# Patient Record
Sex: Female | Born: 1975 | Race: Black or African American | Hispanic: No | Marital: Single | Smoking: Current every day smoker
Health system: Southern US, Community
[De-identification: ages and names within clinical notes are randomized; demographics above are authoritative.]

## PROBLEM LIST (undated history)

## (undated) DIAGNOSIS — G629 Polyneuropathy, unspecified: Secondary | ICD-10-CM

## (undated) DIAGNOSIS — F32A Depression, unspecified: Secondary | ICD-10-CM

## (undated) DIAGNOSIS — F419 Anxiety disorder, unspecified: Secondary | ICD-10-CM

## (undated) DIAGNOSIS — F329 Major depressive disorder, single episode, unspecified: Secondary | ICD-10-CM

## (undated) DIAGNOSIS — R609 Edema, unspecified: Secondary | ICD-10-CM

---

## 1998-03-30 ENCOUNTER — Other Ambulatory Visit: Admission: RE | Admit: 1998-03-30 | Discharge: 1998-03-30 | Payer: Self-pay | Admitting: Dermatology

## 2008-11-27 ENCOUNTER — Emergency Department (HOSPITAL_COMMUNITY): Admission: EM | Admit: 2008-11-27 | Discharge: 2008-11-27 | Payer: Self-pay | Admitting: Emergency Medicine

## 2008-12-31 ENCOUNTER — Emergency Department (HOSPITAL_COMMUNITY): Admission: EM | Admit: 2008-12-31 | Discharge: 2008-12-31 | Payer: Self-pay | Admitting: Emergency Medicine

## 2009-01-04 ENCOUNTER — Emergency Department (HOSPITAL_COMMUNITY): Admission: EM | Admit: 2009-01-04 | Discharge: 2009-01-04 | Payer: Self-pay | Admitting: Emergency Medicine

## 2009-01-23 ENCOUNTER — Emergency Department (HOSPITAL_COMMUNITY): Admission: EM | Admit: 2009-01-23 | Discharge: 2009-01-23 | Payer: Self-pay | Admitting: Emergency Medicine

## 2009-02-22 ENCOUNTER — Emergency Department: Payer: Self-pay | Admitting: Emergency Medicine

## 2010-05-04 IMAGING — CR DG FOOT COMPLETE 3+V*R*
3 series · 3 of 3 positions shown · non-contrast
Comparison: None available

CLINICAL DATA: Fell

RIGHT FOOT COMPLETE - 3+ VIEW

[view not recorded (1 of 3)]
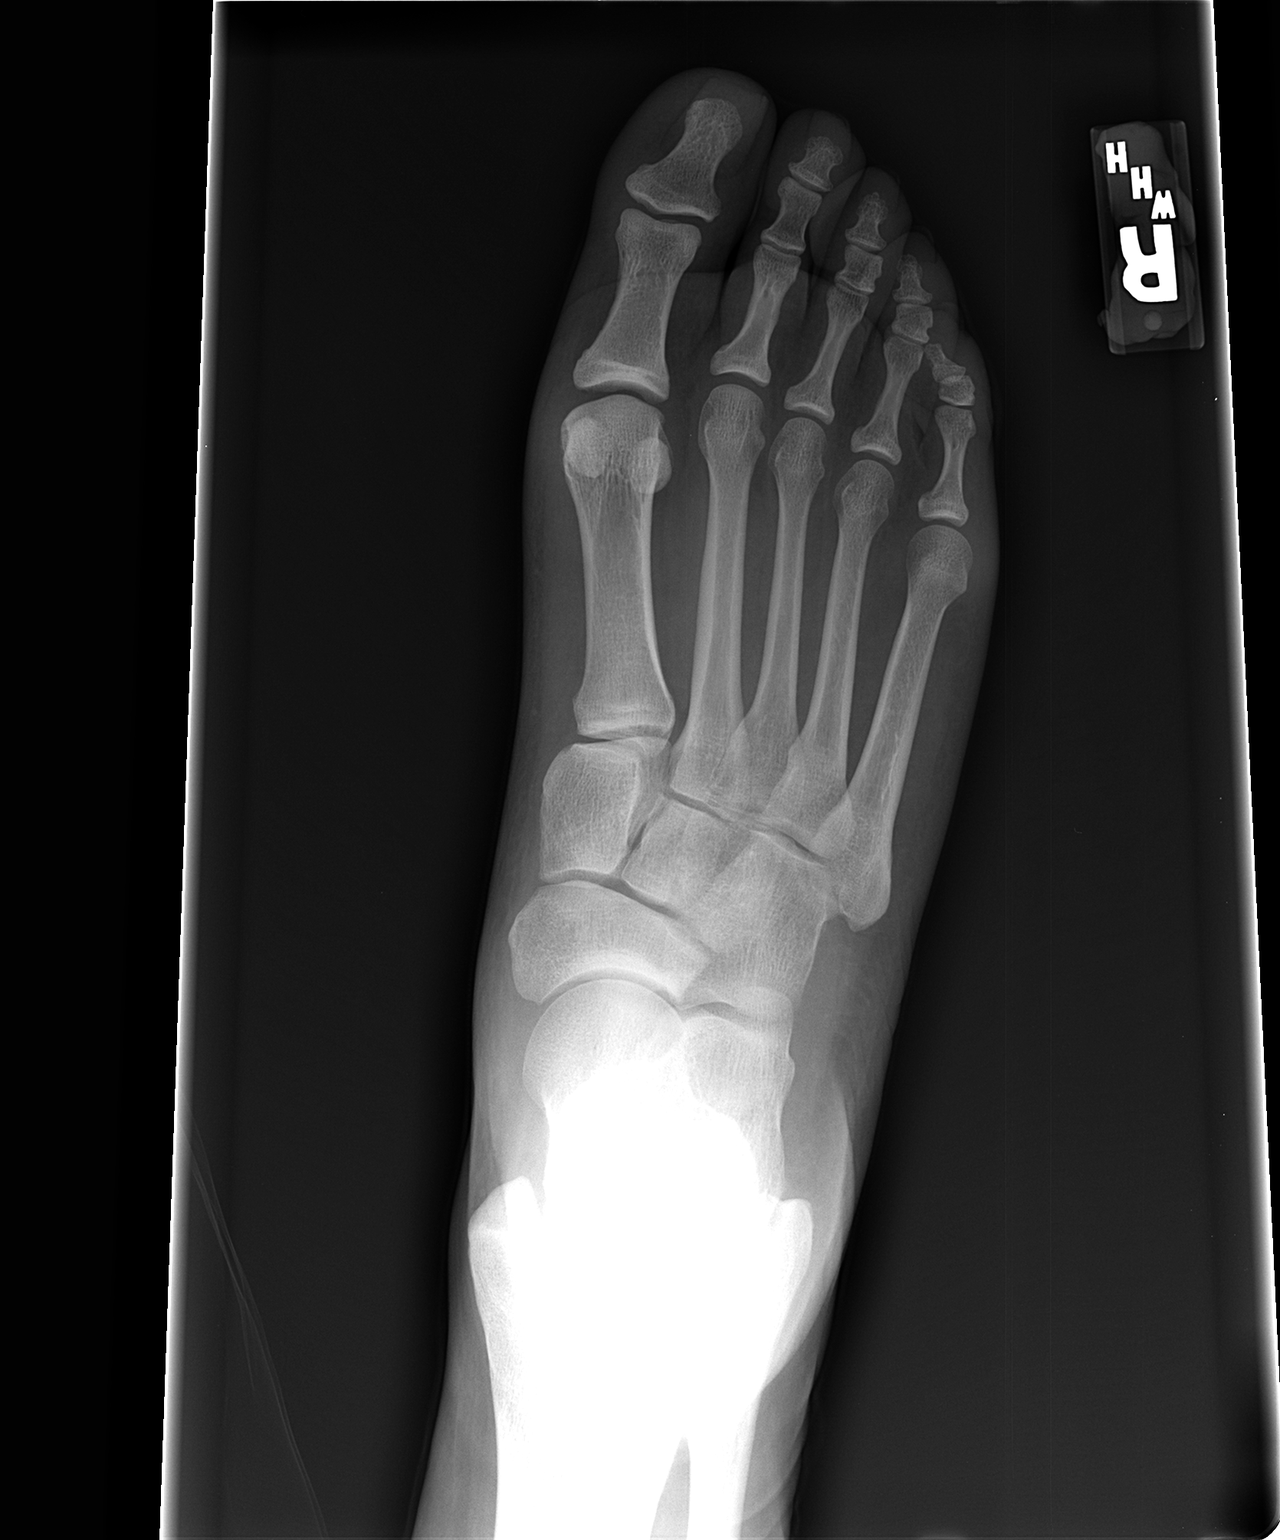

[view not recorded (2 of 3)]
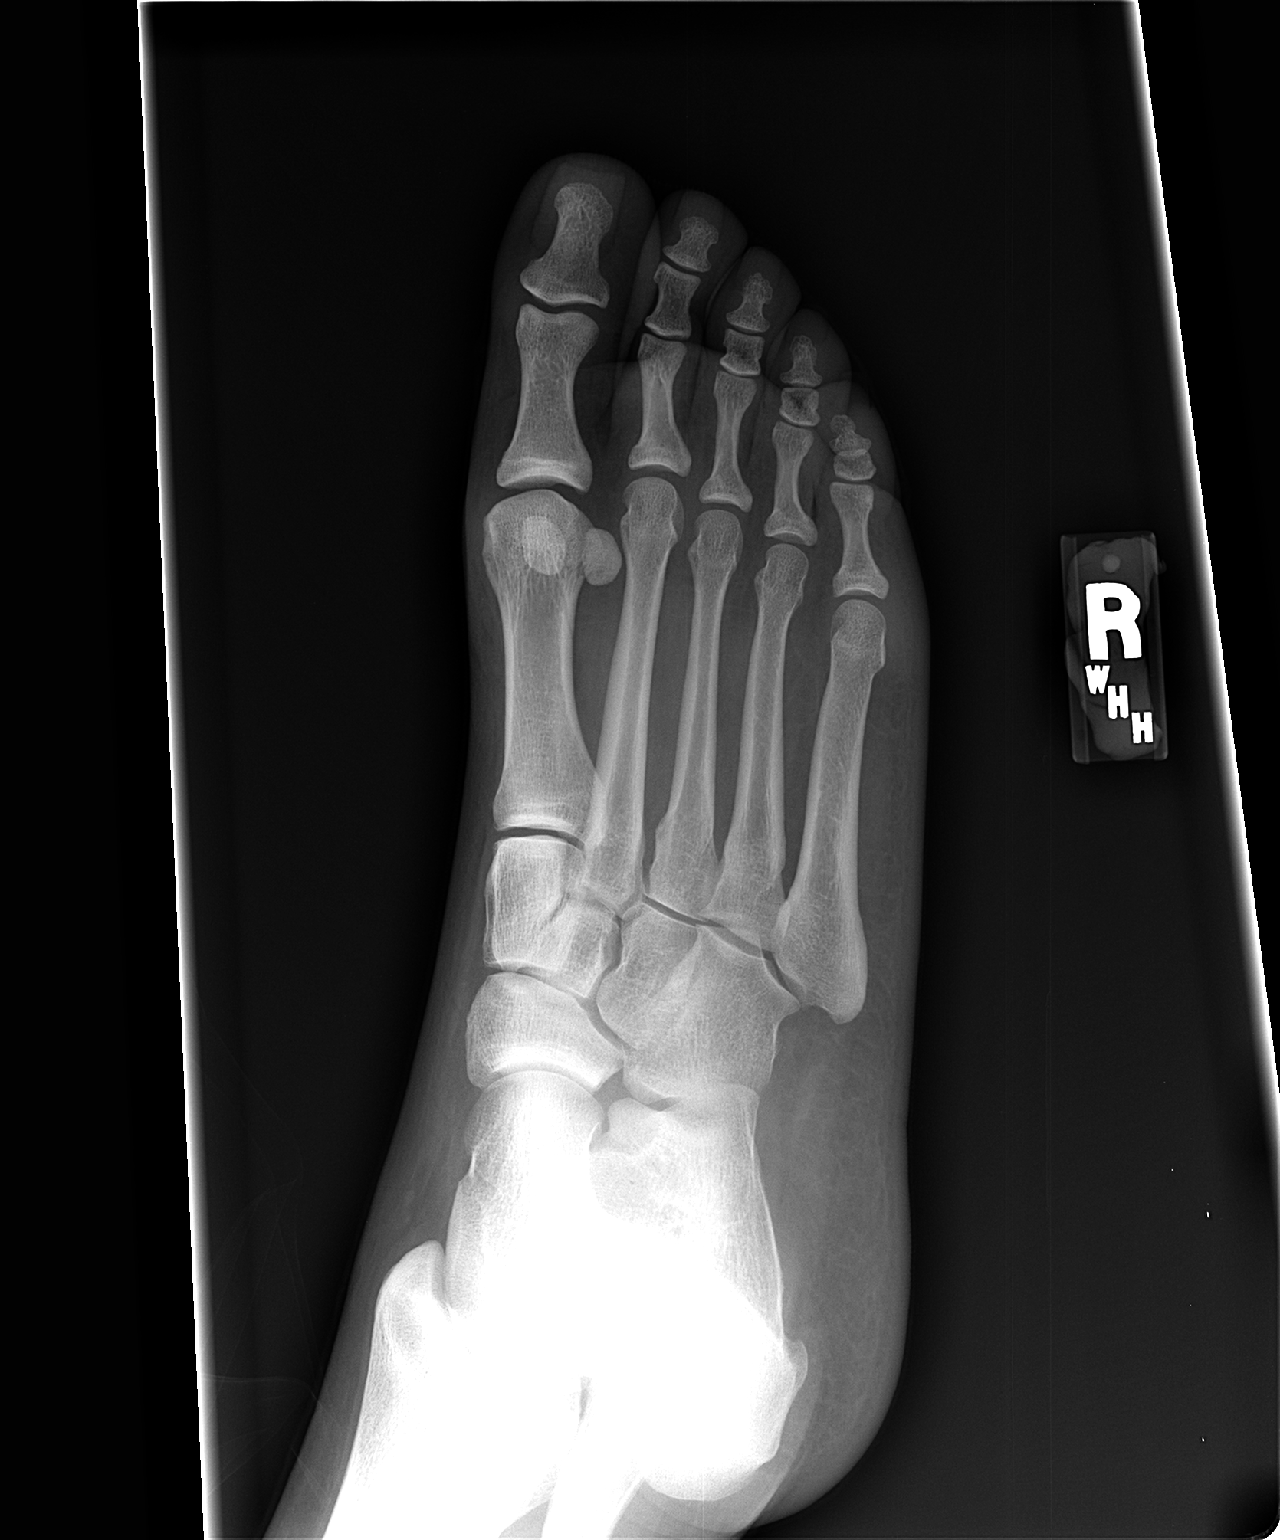

[view not recorded (3 of 3)]
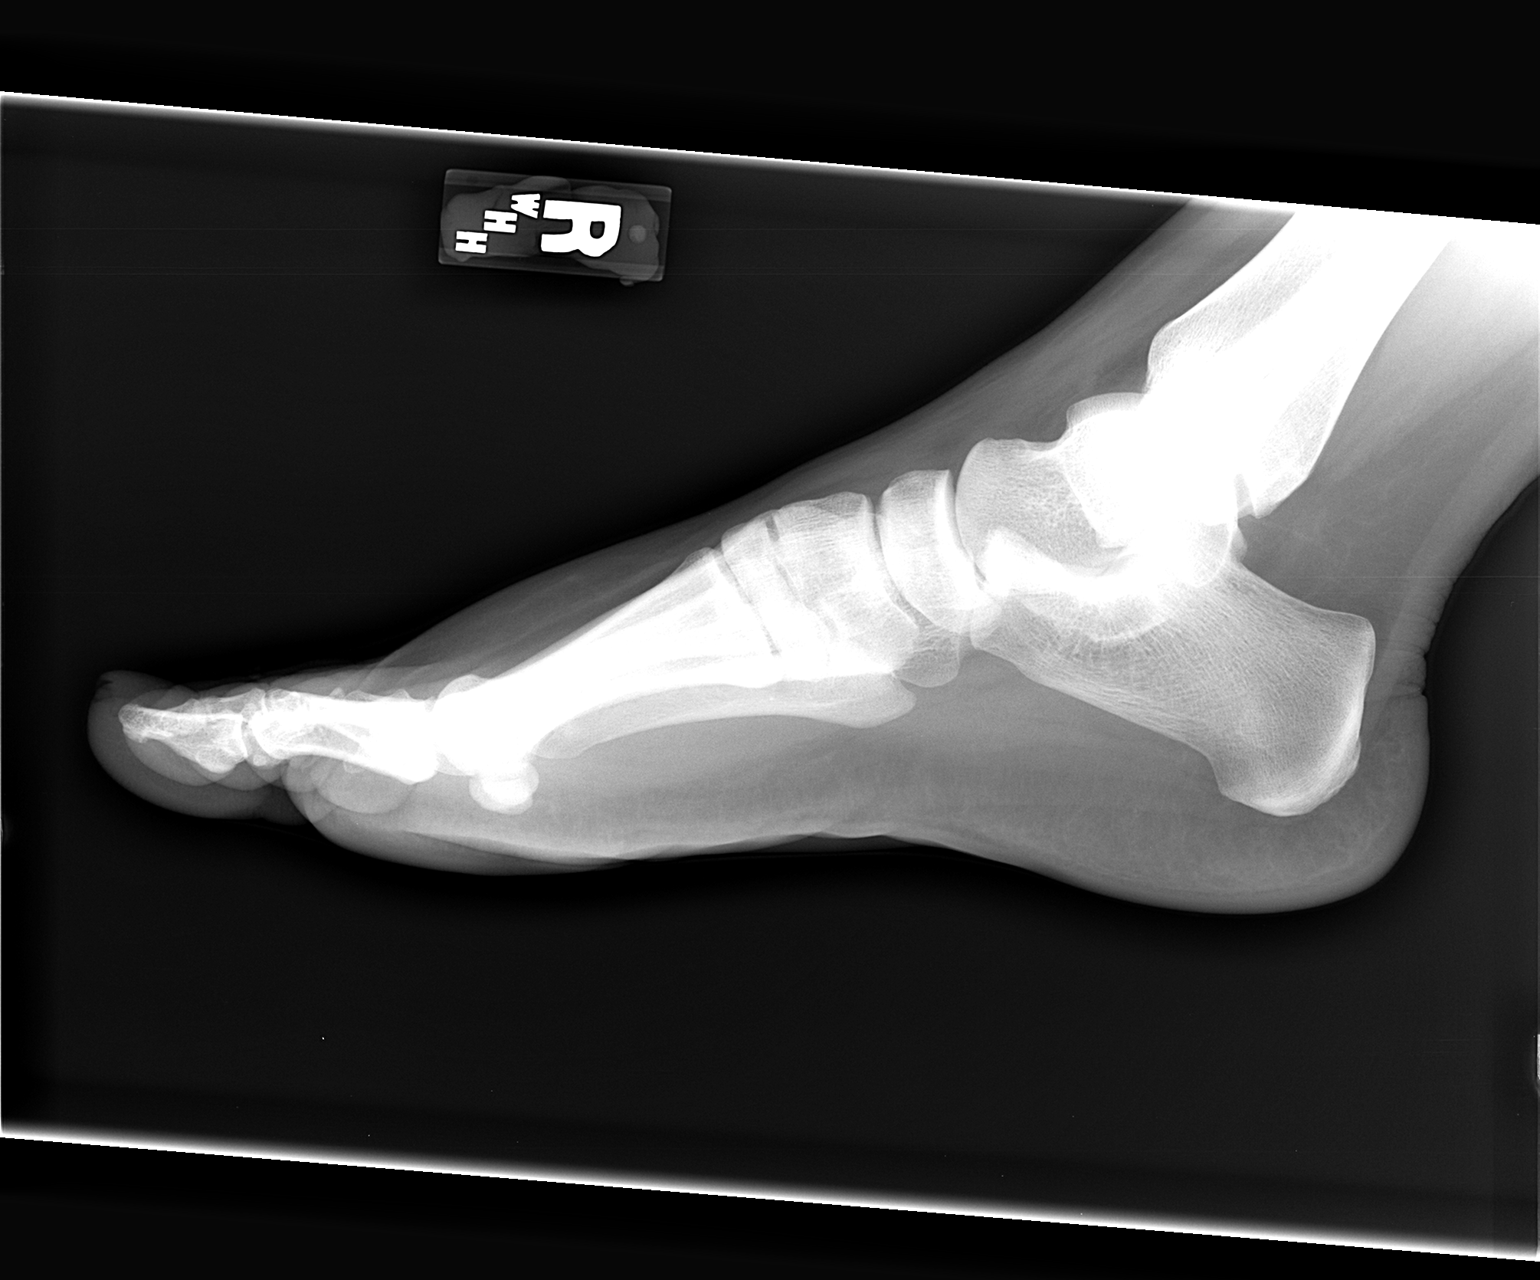

[3 of 3 positions shown; findings below may reference images not displayed]

FINDINGS: Negative for fracture, dislocation, or other acute
abnormality.  Normal alignment and mineralization. No significant
degenerative change.  Regional soft tissues unremarkable.
IMPRESSION: Negative

## 2011-04-01 LAB — BASIC METABOLIC PANEL
CO2: 28 mEq/L (ref 19–32)
Calcium: 9.6 mg/dL (ref 8.4–10.5)
Chloride: 101 mEq/L (ref 96–112)
GFR calc Af Amer: >60 mL/min (ref >60)
Glucose, Bld: 77 mg/dL (ref 70–99)
Potassium: 4 mEq/L (ref 3.5–5.1)
Sodium: 138 mEq/L (ref 135–145)

## 2011-04-01 LAB — CBC
HCT: 44.8 % (ref 36.0–46.0)
Hemoglobin: 14.4 g/dL (ref 12.0–15.0)
MCHC: 32 g/dL (ref 30.0–36.0)
RBC: 5.05 MIL/uL (ref 3.87–5.11)

## 2011-04-01 LAB — URINALYSIS, ROUTINE W REFLEX MICROSCOPIC
Bilirubin Urine: NEGATIVE
Bilirubin Urine: NEGATIVE
Glucose, UA: NEGATIVE mg/dL
Glucose, UA: NEGATIVE mg/dL
Hgb urine dipstick: NEGATIVE
Hgb urine dipstick: NEGATIVE
Protein, ur: NEGATIVE mg/dL
Specific Gravity, Urine: 1.01 (ref 1.005–1.030)
Specific Gravity, Urine: <1.005 — ABNORMAL LOW (ref 1.005–1.030)
Urobilinogen, UA: 0.2 mg/dL (ref 0.0–1.0)
pH: 6 (ref 5.0–8.0)

## 2011-04-01 LAB — DIFFERENTIAL
Basophils Relative: 0 % (ref 0–1)
Eosinophils Relative: 0 % (ref 0–5)
Monocytes Absolute: 0.6 10*3/uL (ref 0.1–1.0)
Monocytes Relative: 6 % (ref 3–12)
Neutro Abs: 6.8 10*3/uL (ref 1.7–7.7)

## 2011-12-17 DIAGNOSIS — R609 Edema, unspecified: Secondary | ICD-10-CM

## 2011-12-17 HISTORY — DX: Edema, unspecified: R60.9

## 2012-12-16 DIAGNOSIS — G629 Polyneuropathy, unspecified: Secondary | ICD-10-CM

## 2012-12-16 HISTORY — DX: Polyneuropathy, unspecified: G62.9

## 2016-02-28 NOTE — H&P (Signed)
  NTS SOAP Note  Vital Signs:  Vitals as of: 02/27/2016: Systolic 128: Diastolic 81: Heart Rate 72: Temp 97.49F (Temporal): Height 45ft 5in: Weight 242Lbs 0 Ounces: BMI 40.27   BMI : 40.27 kg/m2  Subjective: This 40 year old female presents for of an umbilical hernia.  Recently seems to be enlarging in size and causing her discomfort.  Review of Symptoms:  Constitutional:fatigue Head:unremarkable Eyes:unremarkable   Nose/Mouth/Throat:unremarkable Cardiovascular:  unremarkable Respiratory:unremarkable Gastrointestinabdominal pain Genitourinary:unremarkable   joint and back pain Skin:unremarkable Hematolgic/Lymphatic:unremarkable   Allergic/Immunologic:unremarkable   Past Medical History:  Reviewed  Past Medical History  Surgical History: none Medical Problems: none Allergies: nkda Medications: subloxone, celexa   Social History:Reviewed  Social History  Preferred Language: English Race:  Black or African American Ethnicity: Not Hispanic / Latino Age: 1942 year Marital Status:  S Alcohol: unknown   Smoking Status: Light tobacco smoker reviewed on 02/27/2016 Started Date:  Packs per week:  Functional Status reviewed on 02/27/2016 ------------------------------------------------ Bathing: Normal Cooking: Normal Dressing: Normal Driving: Normal Eating: Normal Managing Meds: Normal Oral Care: Normal Shopping: Normal Toileting: Normal Transferring: Normal Walking: Normal Cognitive Status reviewed on 02/27/2016 ------------------------------------------------ Attention: Normal Decision Making: Normal Language: Normal Memory: Normal Motor: Normal Perception: Normal Problem Solving: Normal Visual and Spatial: Normal   Family History:Reviewed  Family Health History Mother, Deceased; Heart disease;  Father, Living; Heart disease;     Objective Information: General:Well appearing, well nourished in no distress. Heart:RRR, no  murmur Lungs:  CTA bilaterally, no wheezes, rhonchi, rales.  Breathing unlabored. Abdomen:Soft, NT/ND, no HSM, no masses.  Reducible umbilical hernia present.  Assessment:Umbilical hernia  Diagnoses: 553.1  K42.9 Umbilical hernia (Umbilical hernia without obstruction or gangrene)  Procedures: [id number] - OFFICE OUTPATIENT NEW 30 MINUTES    Plan:  Scheduled for umbilical herniorrhaphy with mesh on 03/04/16.   Patient Education:Alternative treatments to surgery were discussed with patient (and family).  Risks and benefits  of procedure including bleedingk infection, mesh use, and the possibility of recurrence were fully explained to the patient (and family) who gave informed consent. Patient/family questions were addressed.  Follow-up:Pending Surgery

## 2016-03-01 NOTE — Patient Instructions (Signed)
Aimee Booth  03/01/2016     @   Your procedure is scheduled on  03/06/2016   Report to [facility] at  830  A.M.  Call this number if you have problems the morning of surgery:  [phone]   Remember:  Do not eat food or drink liquids after midnight.  Take these medicines the morning of surgery with A SIP OF WATER  Suboxone, celexa, neurontin.   Do not wear jewelry, make-up or nail polish.  Do not wear lotions, powders, or perfumes.  You may wear deodorant.  Do not shave 48 hours prior to surgery.  Men may shave face and neck.  Do not bring valuables to the hospital.  [facility] is not responsible for any belongings or valuables.  Contacts, dentures or bridgework may not be worn into surgery.  Leave your suitcase in the car.  After surgery it may be brought to your room.  For patients admitted to the hospital, discharge time will be determined by your treatment team.  Patients discharged the day of surgery will not be allowed to drive home.   Name and phone number of your driver:   family Special instructions:  none  Please read over the following fact sheets that you were given. Coughing and Deep Breathing, Surgical Site Infection Prevention, Anesthesia Post-op Instructions and Care and Recovery After Surgery      Umbilical Herniorrhaphy Herniorrhaphy is surgery to repair a hernia. A hernia is the protrusion of a part of an organ through an abdominal opening. An umbilical hernia means that your hernia is in the area around your navel. If the hernia is not repaired, the gap could get bigger. Your intestines or other tissues, such as fat, could get trapped in the gap. This can lead to other health problems, such as blocked intestines. If the hernia is fixed before problems set in, you may be allowed to go home the same day as the surgery (outpatient). LET [facility] CARE PROVIDER KNOW ABOUT:  Allergies to food or medicine.  Medicines taken,  including vitamins, herbs, eye drops, over-the-counter medicines, and creams.  Use of steroids (by mouth or creams).  Previous problems with anesthetics or numbing medicines.  History of bleeding problems or blood clots.  Previous surgery.  Other health problems, including diabetes and kidney problems.  Possibility of pregnancy, if this applies. RISKS AND COMPLICATIONS  Pain.  Excessive bleeding.  Hematoma. This is a pocket of blood that collects under the surgery site.  Infection at the surgery site.  Numbness at the surgery site.  Swelling and bruising.  Blood clots.  Intestinal damage (rare).  Scarring.  Skin damage.  Development of another hernia. This may require another surgery. BEFORE THE PROCEDURE  Ask your health care provider about changing or stopping your regular medicines. You may need to stop taking aspirin, nonsteroidal anti-inflammatory drugs (NSAIDs), vitamin E, and blood thinners as early as 2 weeks before the procedure.  Do not eat or drink for 8 hours before the procedure, or as directed by your health care provider.  You might be asked to shower or wash with an antibacterial soap before the procedure.  Wear comfortable clothes that will be easy to put on after the procedure. PROCEDURE You will be given an intravenous (IV) tube. A needle will be inserted in your arm. Medicine will flow directly into your body through this needle. You might be given medicine to help you relax (  sedative). You will be given medicine that numbs the area (local anesthetic) or medicine that makes you sleep (general anesthetic). If you have open surgery:  The surgeon will make a cut (incision) in your abdomen.  The gap in the muscle wall will be repaired. The surgeon may sew the edges together over the gap or use a mesh material to strengthen the area. When mesh is used, the body grows new, strong tissue into and around it. This new tissue closes the gap.  A drain  might be put in to remove excess fluid from the body after surgery.  The surgeon will close the incision with stitches, glue, or staples. If you have laparoscopic surgery:  The surgeon will make several small incisions in your abdomen.  A thin, lighted tube (laparoscope) will be inserted into the abdomen through an incision. A camera is attached to the laparoscope that allows the surgeon to see inside the abdomen.  Tools will be inserted through the other incisions to repair the hernia. Usually, mesh is used to cover the gap.  The surgeon will close the incisions with stitches. AFTER THE PROCEDURE  You will be taken to a recovery area. A nurse will watch and check your progress.  When you are awake, feeling well, and taking fluids well, you may be allowed to go home. In some cases, you may need to stay overnight in the hospital.  Arrange for someone to drive you home.   This information is not intended to replace advice given to you by your health care provider. Make sure you discuss any questions you have with your health care provider.   Document Released: 02/28/2009 Document Revised: 12/23/2014 Document Reviewed: 03/04/2012 Elsevier Interactive Patient Education 2016 [employer] Umbilical Herniorrhaphy, Care After Refer to this sheet in the next few weeks. These instructions provide you with information on caring for yourself after your procedure. Your health care provider may also give you more specific instructions. Your treatment has been planned according to current medical practices, but problems sometimes occur. Call your health care provider if you have any problems or questions after your procedure. HOME CARE INSTRUCTIONS  If you are given antibiotic medicine, take it as directed. Finish it even if you start to feel better.  Only take over-the-counter or prescription medicines for pain, fever, or discomfort as directed by your health care provider. Do not take aspirin. It  can cause bleeding.  Do not get your surgical cut (incision) area wet unless your health care provider says it is okay.  Avoid lifting objects heavier than 10 lb (4.5 kg) for 8 weeks after surgery.  Avoid sexual activity for 5 weeks after surgery or as directed by your health care provider.  Do not drive while taking prescription pain medicine.  You may return to your other normal, daily activities after 3 days or as directed by your health care provider. SEEK MEDICAL CARE IF:  You notice blood or fluid leaking from the surgical site.  Your incision area becomes red or swollen.  Your pain at the surgical site becomes worse or is not relieved by medicine.  You have problems urinating.  You feel nauseous or vomit more than 2 days after surgery.  You notice the bulge in your abdomen returns after the procedure. SEEK IMMEDIATE MEDICAL CARE IF:  You have a fever.  You have nausea or vomiting that will not stop.   This information is not intended to replace advice given to you by your health care  provider. Make sure you discuss any questions you have with your health care provider.   Document Released: 06/02/2012 Document Revised: 12/23/2014 Document Reviewed: 06/02/2012 Elsevier Interactive Patient Education 2016 [employer] PATIENT INSTRUCTIONS POST-ANESTHESIA  IMMEDIATELY FOLLOWING SURGERY:  Do not drive or operate machinery for the first twenty four hours after surgery.  Do not make any important decisions for twenty four hours after surgery or while taking narcotic pain medications or sedatives.  If you develop intractable nausea and vomiting or a severe headache please notify your doctor immediately.  FOLLOW-UP:  Please make an appointment with your surgeon as instructed. You do not need to follow up with anesthesia unless specifically instructed to do so.  WOUND CARE INSTRUCTIONS (if applicable):  Keep a dry clean dressing on the anesthesia/puncture wound site if there is  drainage.  Once the wound has quit draining you may leave it open to air.  Generally you should leave the bandage intact for twenty four hours unless there is drainage.  If the epidural site drains for more than 36-48 hours please call the anesthesia department.  QUESTIONS?:  Please feel free to call your physician or the hospital operator if you have any questions, and they will be happy to assist you.

## 2016-03-04 ENCOUNTER — Encounter (HOSPITAL_COMMUNITY): Payer: Self-pay

## 2016-03-04 ENCOUNTER — Encounter (HOSPITAL_COMMUNITY)
Admission: RE | Admit: 2016-03-04 | Discharge: 2016-03-04 | Disposition: A | Discharge status: Home / Self Care | Payer: Medicaid Other | Source: Ambulatory Visit | Attending: General Surgery | Admitting: General Surgery

## 2016-03-04 ENCOUNTER — Other Ambulatory Visit: Payer: Self-pay

## 2016-03-04 DIAGNOSIS — Z01818 Encounter for other preprocedural examination: Secondary | ICD-10-CM | POA: Insufficient documentation

## 2016-03-04 HISTORY — DX: Edema, unspecified: R60.9

## 2016-03-04 HISTORY — DX: Anxiety disorder, unspecified: F41.9

## 2016-03-04 HISTORY — DX: Polyneuropathy, unspecified: G62.9

## 2016-03-04 HISTORY — DX: Major depressive disorder, single episode, unspecified: F32.9

## 2016-03-04 HISTORY — DX: Depression, unspecified: F32.A

## 2016-03-04 LAB — BASIC METABOLIC PANEL
ANION GAP: 8 (ref 5–15)
BUN: 9 mg/dL (ref 6–20)
CALCIUM: 9.1 mg/dL (ref 8.9–10.3)
CO2: 33 mmol/L — AB (ref 22–32)
CREATININE: 0.55 mg/dL (ref 0.44–1.00)
Chloride: 96 mmol/L — ABNORMAL LOW (ref 101–111)
GFR calc Af Amer: >60 mL/min (ref >60)
GLUCOSE: 131 mg/dL — AB (ref 65–99)
Potassium: 3.8 mmol/L (ref 3.5–5.1)
Sodium: 137 mmol/L (ref 135–145)

## 2016-03-04 LAB — CBC WITH DIFFERENTIAL/PLATELET
BASOS ABS: 0 10*3/uL (ref 0.0–0.1)
BASOS PCT: 0 %
EOS ABS: 0.1 10*3/uL (ref 0.0–0.7)
Eosinophils Relative: 1 %
HEMATOCRIT: 39.1 % (ref 36.0–46.0)
Hemoglobin: 12.7 g/dL (ref 12.0–15.0)
Lymphocytes Relative: 26 %
Lymphs Abs: 2.3 10*3/uL (ref 0.7–4.0)
MCH: 27.9 pg (ref 26.0–34.0)
MCHC: 32.5 g/dL (ref 30.0–36.0)
MCV: 85.9 fL (ref 78.0–100.0)
MONO ABS: 0.5 10*3/uL (ref 0.1–1.0)
MONOS PCT: 5 %
NEUTROS ABS: 6 10*3/uL (ref 1.7–7.7)
Neutrophils Relative %: 68 %
PLATELETS: 354 10*3/uL (ref 150–400)
RBC: 4.55 MIL/uL (ref 3.87–5.11)
RDW: 14.5 % (ref 11.5–15.5)
WBC: 8.9 10*3/uL (ref 4.0–10.5)

## 2016-03-04 LAB — SURGICAL PCR SCREEN
MRSA, PCR: NEGATIVE
STAPHYLOCOCCUS AUREUS: POSITIVE — AB

## 2016-03-04 LAB — HCG, SERUM, QUALITATIVE: PREG SERUM: NEGATIVE

## 2016-03-05 MED ORDER — MUPIROCIN 2 % EX OINT
TOPICAL_OINTMENT | CUTANEOUS | Status: AC
  Filled 2016-03-05: qty 22

## 2016-03-06 ENCOUNTER — Encounter (HOSPITAL_COMMUNITY): Payer: Self-pay | Admitting: *Deleted

## 2016-03-06 ENCOUNTER — Ambulatory Visit (HOSPITAL_COMMUNITY)
Admission: RE | Admit: 2016-03-06 | Discharge: 2016-03-06 | Disposition: A | Discharge status: Home / Self Care | Payer: Medicaid Other | Source: Ambulatory Visit | Attending: General Surgery | Admitting: General Surgery

## 2016-03-06 ENCOUNTER — Ambulatory Visit (HOSPITAL_COMMUNITY): Payer: Medicaid Other | Admitting: Anesthesiology

## 2016-03-06 ENCOUNTER — Encounter (HOSPITAL_COMMUNITY)
Admission: RE | Disposition: A | Discharge status: Home / Self Care | Payer: Self-pay | Source: Ambulatory Visit | Attending: General Surgery

## 2016-03-06 DIAGNOSIS — Z87891 Personal history of nicotine dependence: Secondary | ICD-10-CM | POA: Insufficient documentation

## 2016-03-06 DIAGNOSIS — F418 Other specified anxiety disorders: Secondary | ICD-10-CM | POA: Diagnosis not present

## 2016-03-06 DIAGNOSIS — Z79899 Other long term (current) drug therapy: Secondary | ICD-10-CM | POA: Insufficient documentation

## 2016-03-06 DIAGNOSIS — K429 Umbilical hernia without obstruction or gangrene: Secondary | ICD-10-CM | POA: Insufficient documentation

## 2016-03-06 HISTORY — PX: INSERTION OF MESH: SHX5868

## 2016-03-06 HISTORY — PX: UMBILICAL HERNIA REPAIR: SHX196

## 2016-03-06 LAB — GLUCOSE, CAPILLARY: GLUCOSE-CAPILLARY: 108 mg/dL — AB (ref 65–99)

## 2016-03-06 SURGERY — REPAIR, HERNIA, UMBILICAL, ADULT
Anesthesia: General

## 2016-03-06 MED ORDER — SODIUM CHLORIDE 0.9 % IR SOLN
Status: DC | PRN
  Administered 2016-03-06: 1000 mL

## 2016-03-06 MED ORDER — FENTANYL CITRATE (PF) 100 MCG/2ML IJ SOLN
INTRAMUSCULAR | Status: AC
  Filled 2016-03-06: qty 2

## 2016-03-06 MED ORDER — PROPOFOL 10 MG/ML IV BOLUS
INTRAVENOUS | Status: DC | PRN
  Administered 2016-03-06: 30 mg via INTRAVENOUS
  Administered 2016-03-06: 170 mg via INTRAVENOUS
  Administered 2016-03-06 (×2): 30 mg via INTRAVENOUS

## 2016-03-06 MED ORDER — PROPOFOL 10 MG/ML IV BOLUS
INTRAVENOUS | Status: AC
  Filled 2016-03-06: qty 20

## 2016-03-06 MED ORDER — ONDANSETRON HCL 4 MG/2ML IJ SOLN: 4.0000 mg | Freq: Once | INTRAMUSCULAR | Status: DC | PRN

## 2016-03-06 MED ORDER — FENTANYL CITRATE (PF) 100 MCG/2ML IJ SOLN
INTRAMUSCULAR | Status: DC | PRN
  Administered 2016-03-06 (×2): 25 ug via INTRAVENOUS
  Administered 2016-03-06: 50 ug via INTRAVENOUS
  Administered 2016-03-06 (×4): 25 ug via INTRAVENOUS

## 2016-03-06 MED ORDER — POVIDONE-IODINE 10 % EX OINT
TOPICAL_OINTMENT | CUTANEOUS | Status: AC
  Filled 2016-03-06: qty 1

## 2016-03-06 MED ORDER — LIDOCAINE HCL (CARDIAC) 10 MG/ML IV SOLN
INTRAVENOUS | Status: DC | PRN
  Administered 2016-03-06: 50 mg via INTRAVENOUS

## 2016-03-06 MED ORDER — LIDOCAINE HCL (PF) 1 % IJ SOLN
INTRAMUSCULAR | Status: AC
  Filled 2016-03-06: qty 5

## 2016-03-06 MED ORDER — BUPIVACAINE HCL (PF) 0.5 % IJ SOLN
INTRAMUSCULAR | Status: AC
  Filled 2016-03-06: qty 30

## 2016-03-06 MED ORDER — ONDANSETRON HCL 4 MG/2ML IJ SOLN
4.0000 mg | Freq: Once | INTRAMUSCULAR | Status: AC
  Administered 2016-03-06: 4 mg via INTRAVENOUS

## 2016-03-06 MED ORDER — BUPIVACAINE HCL (PF) 0.5 % IJ SOLN
INTRAMUSCULAR | Status: DC | PRN
  Administered 2016-03-06: 8 mL

## 2016-03-06 MED ORDER — FENTANYL CITRATE (PF) 100 MCG/2ML IJ SOLN
25.0000 ug | INTRAMUSCULAR | Status: DC | PRN
  Administered 2016-03-06 (×4): 50 ug via INTRAVENOUS
  Filled 2016-03-06: qty 2

## 2016-03-06 MED ORDER — LACTATED RINGERS IV SOLN
INTRAVENOUS | Status: DC
  Administered 2016-03-06: 10:00:00 via INTRAVENOUS

## 2016-03-06 MED ORDER — KETOROLAC TROMETHAMINE 30 MG/ML IJ SOLN
30.0000 mg | Freq: Once | INTRAMUSCULAR | Status: AC
  Administered 2016-03-06: 30 mg via INTRAVENOUS

## 2016-03-06 MED ORDER — CELECOXIB 200 MG PO CAPS: 200.0000 mg | ORAL_CAPSULE | Freq: Two times a day (BID) | ORAL | Status: AC

## 2016-03-06 MED ORDER — ONDANSETRON HCL 4 MG/2ML IJ SOLN
INTRAMUSCULAR | Status: AC
  Filled 2016-03-06: qty 2

## 2016-03-06 MED ORDER — MIDAZOLAM HCL 2 MG/2ML IJ SOLN
1.0000 mg | INTRAMUSCULAR | Status: DC | PRN
  Administered 2016-03-06: 2 mg via INTRAVENOUS

## 2016-03-06 MED ORDER — MIDAZOLAM HCL 2 MG/2ML IJ SOLN
INTRAMUSCULAR | Status: AC
  Filled 2016-03-06: qty 2

## 2016-03-06 MED ORDER — CEFAZOLIN SODIUM-DEXTROSE 2-3 GM-% IV SOLR
2.0000 g | INTRAVENOUS | Status: AC
  Administered 2016-03-06: 2 g via INTRAVENOUS
  Filled 2016-03-06: qty 50

## 2016-03-06 MED ORDER — CHLORHEXIDINE GLUCONATE 4 % EX LIQD: 1.0000 "application " | Freq: Once | CUTANEOUS | Status: DC

## 2016-03-06 MED ORDER — KETOROLAC TROMETHAMINE 30 MG/ML IJ SOLN
INTRAMUSCULAR | Status: AC
  Filled 2016-03-06: qty 1

## 2016-03-06 MED ORDER — POVIDONE-IODINE 10 % OINT PACKET
TOPICAL_OINTMENT | CUTANEOUS | Status: DC | PRN
  Administered 2016-03-06: 1 via TOPICAL

## 2016-03-06 SURGICAL SUPPLY — 33 items
BAG HAMPER (MISCELLANEOUS) ×3 IMPLANT
BLADE SURG SZ11 CARB STEEL (BLADE) ×3 IMPLANT
CHLORAPREP W/TINT 26ML (MISCELLANEOUS) ×3 IMPLANT
CLOTH BEACON ORANGE TIMEOUT ST (SAFETY) ×3 IMPLANT
COVER LIGHT HANDLE STERIS (MISCELLANEOUS) ×6 IMPLANT
DECANTER SPIKE VIAL GLASS SM (MISCELLANEOUS) ×3 IMPLANT
ELECT REM PT RETURN 9FT ADLT (ELECTROSURGICAL) ×3
ELECTRODE REM PT RTRN 9FT ADLT (ELECTROSURGICAL) ×2 IMPLANT
GLOVE BIOGEL PI IND STRL 7.0 (GLOVE) ×2 IMPLANT
GLOVE BIOGEL PI INDICATOR 7.0 (GLOVE) ×1
GLOVE ECLIPSE 6.5 STRL STRAW (GLOVE) ×1 IMPLANT
GLOVE EXAM NITRILE MD LF STRL (GLOVE) ×3 IMPLANT
GLOVE SURG SS PI 7.5 STRL IVOR (GLOVE) ×5 IMPLANT
GOWN STRL REUS W/ TWL LRG LVL3 (GOWN DISPOSABLE) ×2 IMPLANT
GOWN STRL REUS W/TWL LRG LVL3 (GOWN DISPOSABLE) ×8 IMPLANT
INST SET MINOR GENERAL (KITS) ×3 IMPLANT
KIT ROOM TURNOVER APOR (KITS) ×3 IMPLANT
MANIFOLD NEPTUNE II (INSTRUMENTS) ×3 IMPLANT
MESH VENTRALEX ST 2.5 CRC MED (Mesh General) ×3 IMPLANT
NDL HYPO 25X1 1.5 SAFETY (NEEDLE) ×2 IMPLANT
NEEDLE HYPO 25X1 1.5 SAFETY (NEEDLE) ×3 IMPLANT
NS IRRIG 1000ML POUR BTL (IV SOLUTION) ×3 IMPLANT
PACK MINOR (CUSTOM PROCEDURE TRAY) ×3 IMPLANT
PAD ARMBOARD 7.5X6 YLW CONV (MISCELLANEOUS) ×3 IMPLANT
SET BASIN LINEN APH (SET/KITS/TRAYS/PACK) ×3 IMPLANT
SPONGE GAUZE 4X4 12PLY (GAUZE/BANDAGES/DRESSINGS) ×1 IMPLANT
STAPLER VISISTAT (STAPLE) ×3 IMPLANT
SUT ETHIBOND NAB MO 7 #0 18IN (SUTURE) ×4 IMPLANT
SUT VIC AB 2-0 CT2 27 (SUTURE) ×3 IMPLANT
SUT VIC AB 3-0 SH 27 (SUTURE) ×3
SUT VIC AB 3-0 SH 27X BRD (SUTURE) ×2 IMPLANT
SYR CONTROL 10ML LL (SYRINGE) ×3 IMPLANT
TAPE CLOTH SURG 4X10 WHT LF (GAUZE/BANDAGES/DRESSINGS) ×1 IMPLANT

## 2016-03-06 NOTE — Interval H&P Note (Signed)
History and Physical Interval Note:  03/06/2016 9:19 AM  Aimee Booth  has presented today for surgery, with the diagnosis of umbilical hernia  The various methods of treatment have been discussed with the patient and family. After consideration of risks, benefits and other options for treatment, the patient has consented to  Procedure(s): HERNIA REPAIR UMBILICAL ADULT WITH MESH (N/A) as a surgical intervention .  The patient's history has been reviewed, patient examined, no change in status, stable for surgery.  I have reviewed the patient's chart and labs.  Questions were answered to the patient's satisfaction.     Franky MachoJENKINS,Ngoc Detjen A

## 2016-03-06 NOTE — Op Note (Signed)
Patient:  Rosaura Carpenteronia D Zingg  DOB:  09/02/1976  MRN:  [id number]   Preop Diagnosis:  Umbilical hernia  Postop Diagnosis:  same  Procedure:  Umbilical herniorrhaphy with mesh  Surgeon:  Franky MachoMark Zyir Gassert, M.D.  Anes:  general  Indications:  Patient is a 40 year old black female who presents with a symptomatic umbilical hernia. The risks and benefits of the procedure including bleeding, infection, mesh use, and the possibility of recurrence of the hernia were fully explained to the patient, who gave informed consent.  Procedure note:  The patient was placedin the supine position. After general anesthesia was administered, the abdomen was prepped and draped using usual sterile technique with DuraPrep. Surgical site confirmation was performed.  An infraumbilical incision was made down to the fascia. The umbilicus was freed away from the underlying hernia sac. The hernia sac was excised to the fascia. A 2-3 cm hernia defect was noted. The omentum was reduced back into the abdominal cavity. A 6.4 cm Bard Ventralax ST patch was then inserted and secured to the fascia using 0 Ethibond interrupted sutures. The overlying fascia was reapproximated using an 0 Ethibond interrupted suture. The base the umbilicus was secured back to the fascia using a 2-0 Vicryl interrupted suture. The subcutaneous layer was reapproximated using 3-0 Vicryl interrupted suture. 0.5% Sensorcaine was instilled into the surrounding wound. The skin was closed using staples. Betadine ointment and dry sterile dressings were applied.  All tape and needle counts were correct at the end of the procedure. Patient was awakened and transferred to PACU in stable condition.  Complications:  none  EBL:  minimal  Specimen:  none

## 2016-03-06 NOTE — Anesthesia Postprocedure Evaluation (Signed)
Anesthesia Post Note  Patient: Aimee Booth  Procedure(s) Performed: Procedure(s) (LRB): UMBILICAL HERNIORRHAPHY WITH MESH (N/A) INSERTION OF MESH  Patient location during evaluation: PACU Anesthesia Type: General Level of consciousness: awake and alert Pain control: 7/10 meds being administered in PACU. Vital Signs Assessment: post-procedure vital signs reviewed and stable Respiratory status: spontaneous breathing Cardiovascular status: blood pressure returned to baseline Anesthetic complications: no    Last Vitals:  Filed Vitals:   03/06/16 1117 03/06/16 1130  BP:  139/90  Pulse: 81 76  Temp:    Resp: 17 13    Last Pain:  Filed Vitals:   03/06/16 1133  PainSc: 7                  Kirt Chew

## 2016-03-06 NOTE — Transfer of Care (Signed)
Immediate Anesthesia Transfer of Care Note  Patient: Aimee Booth  Procedure(s) Performed: Procedure(s): UMBILICAL HERNIORRHAPHY WITH MESH (N/A) INSERTION OF MESH  Patient Location: PACU  Anesthesia Type:General  Level of Consciousness: awake and patient cooperative  Airway & Oxygen Therapy: Patient Spontanous Breathing and non-rebreather face mask  Post-op Assessment: Report given to RN, Post -op Vital signs reviewed and stable and Patient moving all extremities  Post vital signs: Reviewed and stable    Complications: No apparent anesthesia complications

## 2016-03-06 NOTE — Anesthesia Procedure Notes (Signed)
Procedure Name: LMA Insertion Date/Time: 03/06/2016 10:05 AM Performed by: Franco NonesYATES, Maggie Dworkin S Pre-anesthesia Checklist: Patient identified, Patient being monitored, Emergency Drugs available, Timeout performed and Suction available Patient Re-evaluated:Patient Re-evaluated prior to inductionOxygen Delivery Method: Circle System Utilized Preoxygenation: Pre-oxygenation with 100% oxygen Intubation Type: IV induction Ventilation: Mask ventilation without difficulty LMA: LMA inserted LMA Size: 4.0 Number of attempts: 1 Placement Confirmation: positive ETCO2 and breath sounds checked- equal and bilateral Tube secured with: Tape Dental Injury: Teeth and Oropharynx as per pre-operative assessment

## 2016-03-06 NOTE — Discharge Instructions (Signed)
Umbilical Herniorrhaphy, Care After °Refer to this sheet in the next few weeks. These instructions provide you with information on caring for yourself after your procedure. Your health care provider may also give you more specific instructions. Your treatment has been planned according to current medical practices, but problems sometimes occur. Call your health care provider if you have any problems or questions after your procedure. °HOME CARE INSTRUCTIONS °· If you are given antibiotic medicine, take it as directed. Finish it even if you start to feel better. °· Only take over-the-counter or prescription medicines for pain, fever, or discomfort as directed by your health care provider. Do not take aspirin. It can cause bleeding. °· Do not get your surgical cut (incision) area wet unless your health care provider says it is okay. °· Avoid lifting objects heavier than 10 lb (4.5 kg) for 8 weeks after surgery. °· Avoid sexual activity for 5 weeks after surgery or as directed by your health care provider. °· Do not drive while taking prescription pain medicine. °· You may return to your other normal, daily activities after 3 days or as directed by your health care provider. °SEEK MEDICAL CARE IF: °· You notice blood or fluid leaking from the surgical site. °· Your incision area becomes red or swollen. °· Your pain at the surgical site becomes worse or is not relieved by medicine. °· You have problems urinating. °· You feel nauseous or vomit more than 2 days after surgery. °· You notice the bulge in your abdomen returns after the procedure. °SEEK IMMEDIATE MEDICAL CARE IF: °· You have a fever. °· You have nausea or vomiting that will not stop. °  °This information is not intended to replace advice given to you by your health care provider. Make sure you discuss any questions you have with your health care provider. °  °Document Released: 06/02/2012 Document Revised: 12/23/2014 Document Reviewed: 06/02/2012 °Elsevier  Interactive Patient Education ©2016 Elsevier Inc. ° °

## 2016-03-06 NOTE — Anesthesia Preprocedure Evaluation (Signed)
Anesthesia Evaluation  Patient identified by MRN, date of birth, ID band Patient awake    Reviewed: Allergy & Precautions, NPO status , Patient's Chart, lab work & pertinent test results  Airway Mallampati: III  TM Distance: >3 FB     Dental  (+) Teeth Intact, Dental Advisory Given   Pulmonary Current Smoker,    breath sounds clear to auscultation       Cardiovascular negative cardio ROS   Rhythm:Regular Rate:Normal     Neuro/Psych PSYCHIATRIC DISORDERS Anxiety Depression    GI/Hepatic negative GI ROS,   Endo/Other    Renal/GU      Musculoskeletal   Abdominal   Peds  Hematology   Anesthesia Other Findings Hx opioid addiction - on Suboxone Rx.  Reproductive/Obstetrics                             Anesthesia Physical Anesthesia Plan  ASA: II  Anesthesia Plan: General   Post-op Pain Management:    Induction: Intravenous  Airway Management Planned: LMA  Additional Equipment:   Intra-op Plan:   Post-operative Plan: Extubation in OR  Informed Consent: I have reviewed the patients History and Physical, chart, labs and discussed the procedure including the risks, benefits and alternatives for the proposed anesthesia with the patient or authorized representative who has indicated his/her understanding and acceptance.     Plan Discussed with:   Anesthesia Plan Comments:         Anesthesia Quick Evaluation

## 2016-03-07 ENCOUNTER — Encounter (HOSPITAL_COMMUNITY): Payer: Self-pay | Admitting: General Surgery

## 2016-06-12 ENCOUNTER — Other Ambulatory Visit (HOSPITAL_COMMUNITY): Payer: Self-pay | Admitting: Respiratory Therapy

## 2016-06-12 DIAGNOSIS — G4733 Obstructive sleep apnea (adult) (pediatric): Secondary | ICD-10-CM

## 2016-06-12 DIAGNOSIS — F5104 Psychophysiologic insomnia: Secondary | ICD-10-CM

## 2016-06-12 DIAGNOSIS — M79673 Pain in unspecified foot: Secondary | ICD-10-CM

## 2016-06-12 DIAGNOSIS — M545 Low back pain: Secondary | ICD-10-CM

## 2017-01-29 ENCOUNTER — Other Ambulatory Visit: Payer: Self-pay | Admitting: Neurology

## 2017-01-29 DIAGNOSIS — I824Y9 Acute embolism and thrombosis of unspecified deep veins of unspecified proximal lower extremity: Secondary | ICD-10-CM

## 2017-02-04 ENCOUNTER — Ambulatory Visit (HOSPITAL_COMMUNITY): Admission: RE | Admit: 2017-02-04 | Payer: Medicaid Other | Source: Ambulatory Visit
